# Patient Record
Sex: Female | Born: 1995 | Race: White | Hispanic: No | Marital: Single | Smoking: Never smoker
Health system: Southern US, Community
[De-identification: ages and names within clinical notes are randomized; demographics above are authoritative.]

## PROBLEM LIST (undated history)

## (undated) HISTORY — PX: FOOT FRACTURE SURGERY: SHX645

---

## 2013-08-05 ENCOUNTER — Emergency Department (HOSPITAL_COMMUNITY): Payer: BC Managed Care – PPO

## 2013-08-05 ENCOUNTER — Encounter (HOSPITAL_COMMUNITY): Payer: Self-pay | Admitting: Emergency Medicine

## 2013-08-05 ENCOUNTER — Emergency Department (HOSPITAL_COMMUNITY)
Admission: EM | Admit: 2013-08-05 | Discharge: 2013-08-05 | Disposition: A | Discharge status: Home / Self Care | Payer: BC Managed Care – PPO | Attending: Emergency Medicine | Admitting: Emergency Medicine

## 2013-08-05 DIAGNOSIS — Z9889 Other specified postprocedural states: Secondary | ICD-10-CM | POA: Insufficient documentation

## 2013-08-05 DIAGNOSIS — IMO0002 Reserved for concepts with insufficient information to code with codable children: Secondary | ICD-10-CM | POA: Insufficient documentation

## 2013-08-05 DIAGNOSIS — S93601A Unspecified sprain of right foot, initial encounter: Secondary | ICD-10-CM

## 2013-08-05 DIAGNOSIS — Y939 Activity, unspecified: Secondary | ICD-10-CM | POA: Insufficient documentation

## 2013-08-05 DIAGNOSIS — S93609A Unspecified sprain of unspecified foot, initial encounter: Secondary | ICD-10-CM | POA: Insufficient documentation

## 2013-08-05 DIAGNOSIS — Y929 Unspecified place or not applicable: Secondary | ICD-10-CM | POA: Insufficient documentation

## 2013-08-05 DIAGNOSIS — Z79899 Other long term (current) drug therapy: Secondary | ICD-10-CM | POA: Insufficient documentation
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
## 2013-08-05 NOTE — ED Notes (Signed)
Patient transported to X-ray 

## 2013-08-06 NOTE — ED Provider Notes (Signed)
Medical screening examination/treatment/procedure(s) were performed by non-physician practitioner and as supervising physician I was immediately available for consultation/collaboration.     Tinisha Etzkorn M Tesean Stump, MD 08/06/13 0233 

## 2015-08-05 ENCOUNTER — Emergency Department (INDEPENDENT_AMBULATORY_CARE_PROVIDER_SITE_OTHER): Payer: BLUE CROSS/BLUE SHIELD

## 2015-08-05 ENCOUNTER — Encounter (HOSPITAL_COMMUNITY): Payer: Self-pay | Admitting: Emergency Medicine

## 2015-08-05 ENCOUNTER — Emergency Department (HOSPITAL_COMMUNITY)
Admission: EM | Admit: 2015-08-05 | Discharge: 2015-08-05 | Disposition: A | Discharge status: Home / Self Care | Payer: BLUE CROSS/BLUE SHIELD | Source: Home / Self Care | Attending: Family Medicine | Admitting: Family Medicine

## 2015-08-05 DIAGNOSIS — G8929 Other chronic pain: Secondary | ICD-10-CM

## 2015-08-05 DIAGNOSIS — M25562 Pain in left knee: Secondary | ICD-10-CM

## 2015-08-05 MED ORDER — MELOXICAM 15 MG PO TABS: 7.5000 mg | ORAL_TABLET | Freq: Every day | ORAL | Status: DC

## 2015-08-05 NOTE — ED Notes (Signed)
Pt here with c/o intermit R knee pain s/p injury ice skating  xrays negative C/o sharp radiating pains behind the knee Limping

## 2015-08-05 NOTE — ED Provider Notes (Signed)
CSN: [id number]     Arrival date & time 08/05/15  1347 History   First MD Initiated Contact with Patient 08/05/15 1500     Chief Complaint  Patient presents with  . Knee Pain   (Consider location/radiation/quality/duration/timing/severity/associated sxs/prior Treatment) HPI  R knee pain. Injured knee during ice skating. Hurt ever since injury. Entire front of knee hurts. Occasional numb and sharp. Worse w/ ambulation. Ibuprofen 400 w/o improvement. No better or worse since onset. No radiation of pain.  Denies any rash, fevers, other joint involvement, spine weight loss fevers or night sweats.  History reviewed. No pertinent past medical history. Past Surgical History  Procedure Laterality Date  . Foot fracture surgery Right    History reviewed. No pertinent family history. Social History  Substance Use Topics  . Smoking status: Never Smoker   . Smokeless tobacco: None  . Alcohol Use: No   OB History    No data available     Review of Systems Per HPI with all other pertinent systems negative.   Allergies  Review of patient's allergies indicates no known allergies.  Home Medications   Prior to Admission medications   Medication Sig Start Date End Date Taking? Authorizing Provider  meloxicam (MOBIC) 15 MG tablet Take 0.5-1 tablets (7.5-15 mg total) by mouth daily. 08/05/15   Ozella Rocks, MD  PRESCRIPTION MEDICATION Take 1 capsule by mouth daily. Folcalin    Historical Provider, MD   Meds Ordered and Administered this Visit  Medications - No data to display  BP 130/75 mmHg  Pulse 112  Temp(Src) 98.6 F (37 C) (Oral)  Resp 16  SpO2 100%  LMP 06/13/2015 No data found.   Physical Exam Physical Exam  Constitutional: oriented to person, place, and time. appears well-developed and well-nourished. No distress.  HENT:  Head: Normocephalic and atraumatic.  Eyes: EOMI. PERRL.  Neck: Normal range of motion.  Cardiovascular: RRR, no m/r/g, 2+ distal pulses,   Pulmonary/Chest: Effort normal and breath sounds normal. No respiratory distress.  Abdominal: Soft. Bowel sounds are normal. NonTTP, no distension.  Musculoskeletal: R knee FROM, diffusely ttp intermittently. Lachman's negative, valgus and varus stresses without any change in amount of tenderness, and no appreciable laxity. Mild crepitus..  no effusions unable to fully bear weight on affected leg.  Neurological: alert and oriented to person, place, and time.  Skin: Skin is warm. No rash noted. non diaphoretic.  Psychiatric: normal mood and affect. behavior is normal. Judgment and thought content normal.   ED Course  Procedures (including critical care time)  Labs Review Labs Reviewed - No data to display  Imaging Review Dg Knee Complete 4 Views Right  08/05/2015  CLINICAL DATA:  Remote history of I skating injury, fall landing on right knee. Pain. EXAM: RIGHT KNEE - COMPLETE 4+ VIEW COMPARISON:  None. FINDINGS: There is no evidence of fracture, dislocation, or joint effusion. There is no evidence of arthropathy or other focal bone abnormality. Soft tissues are unremarkable. IMPRESSION: Negative. Electronically Signed   By: Charlett Nose M.D.   On: 08/05/2015 16:21     Visual Acuity Review  Right Eye Distance:   Left Eye Distance:   Bilateral Distance:    Right Eye Near:   Left Eye Near:    Bilateral Near:         MDM   1. Knee pain, chronic, left    No evidence of acute intra-articular process. Question meniscal injury or patellofemoral syndrome. Patient to start exercises, and use meloxicam.  Recommend patient use neoprene sleeve. Follow-up with her orthopedic surgeon back in [place] or sports medicine here in town. Patient requesting crutches. Patient's exam does not seem to fully correlate with history and x-ray findings.    Ozella Rocksavid J Kambree Krauss, MD 08/05/15 [phone]

## 2015-08-05 NOTE — Discharge Instructions (Signed)
The cause of your knee pain is not immediately clear. This may be due to a meniscal injury. Please start the meloxicam as prescribed. This will help your pain and inflammation. Please wear a neoprene knee sleeve every day. Use the crutches as needed. Please follow-up with either sports medicine here in town or with your home orthopedic surgeon.

## 2015-08-26 ENCOUNTER — Emergency Department (HOSPITAL_COMMUNITY)
Admission: EM | Admit: 2015-08-26 | Discharge: 2015-08-27 | Disposition: A | Discharge status: Home / Self Care | Payer: BLUE CROSS/BLUE SHIELD | Attending: Emergency Medicine | Admitting: Emergency Medicine

## 2015-08-26 ENCOUNTER — Encounter (HOSPITAL_COMMUNITY): Payer: Self-pay | Admitting: Emergency Medicine

## 2015-08-26 DIAGNOSIS — R45851 Suicidal ideations: Secondary | ICD-10-CM | POA: Diagnosis present

## 2015-08-26 DIAGNOSIS — R630 Anorexia: Secondary | ICD-10-CM | POA: Insufficient documentation

## 2015-08-26 DIAGNOSIS — Z79899 Other long term (current) drug therapy: Secondary | ICD-10-CM | POA: Insufficient documentation

## 2015-08-26 DIAGNOSIS — F39 Unspecified mood [affective] disorder: Secondary | ICD-10-CM | POA: Diagnosis not present

## 2015-08-26 DIAGNOSIS — F332 Major depressive disorder, recurrent severe without psychotic features: Secondary | ICD-10-CM | POA: Diagnosis not present

## 2015-08-26 LAB — CBC
HEMATOCRIT: 37.3 % (ref 36.0–46.0)
Hemoglobin: 12 g/dL (ref 12.0–15.0)
MCH: 25.7 pg — ABNORMAL LOW (ref 26.0–34.0)
MCHC: 32.2 g/dL (ref 30.0–36.0)
MCV: 79.9 fL (ref 78.0–100.0)
Platelets: 430 10*3/uL — ABNORMAL HIGH (ref 150–400)
RBC: 4.67 MIL/uL (ref 3.87–5.11)
RDW: 16.1 % — AB (ref 11.5–15.5)
WBC: 10.2 10*3/uL (ref 4.0–10.5)

## 2015-08-26 LAB — COMPREHENSIVE METABOLIC PANEL
ALK PHOS: 64 U/L (ref 38–126)
ALT: 15 U/L (ref 14–54)
AST: 23 U/L (ref 15–41)
Albumin: 4 g/dL (ref 3.5–5.0)
Anion gap: 8 (ref 5–15)
BILIRUBIN TOTAL: 0.4 mg/dL (ref 0.3–1.2)
BUN: 15 mg/dL (ref 6–20)
CO2: 22 mmol/L (ref 22–32)
Calcium: 9.6 mg/dL (ref 8.9–10.3)
Chloride: 110 mmol/L (ref 101–111)
Creatinine, Ser: 0.69 mg/dL (ref 0.44–1.00)
GFR calc Af Amer: >60 mL/min (ref >60)
GFR calc non Af Amer: >60 mL/min (ref >60)
Glucose, Bld: 117 mg/dL — ABNORMAL HIGH (ref 65–99)
Potassium: 3.5 mmol/L (ref 3.5–5.1)
SODIUM: 140 mmol/L (ref 135–145)
TOTAL PROTEIN: 7.1 g/dL (ref 6.5–8.1)

## 2015-08-26 LAB — ETHANOL: Alcohol, Ethyl (B): <5 mg/dL (ref <5)

## 2015-08-26 LAB — SALICYLATE LEVEL: Salicylate Lvl: <4 mg/dL (ref 2.8–30.0)

## 2015-08-26 LAB — ACETAMINOPHEN LEVEL: Acetaminophen (Tylenol), Serum: <10 ug/mL — ABNORMAL LOW (ref 10–30)

## 2015-08-26 MED ORDER — LORAZEPAM 1 MG PO TABS
1.0000 mg | ORAL_TABLET | Freq: Three times a day (TID) | ORAL | Status: DC | PRN
  Administered 2015-08-26: 1 mg via ORAL
  Filled 2015-08-26: qty 1

## 2015-08-26 MED ORDER — MELOXICAM 7.5 MG PO TABS: 7.5000 mg | ORAL_TABLET | Freq: Every day | ORAL | Status: DC

## 2015-08-26 MED ORDER — DEXMETHYLPHENIDATE HCL ER 5 MG PO CP24: 10.0000 mg | ORAL_CAPSULE | Freq: Every day | ORAL | Status: DC

## 2015-08-26 MED ORDER — ONDANSETRON HCL 4 MG PO TABS: 4.0000 mg | ORAL_TABLET | Freq: Three times a day (TID) | ORAL | Status: DC | PRN

## 2015-08-26 NOTE — ED Notes (Signed)
Pt. To [facility] from ED ambulatory without difficulty, to room 37 . Report from Beazer HomesFrances RN. Pt. Is alert and oriented, warm and dry in no distress. Pt. Denies SI, HI, and AVH. Pt. Calm and cooperative. Pt. Made aware of security cameras and Q15 minute rounds. Pt. Encouraged to let Nursing staff know of any concerns or needs.
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
## 2015-08-26 NOTE — ED Notes (Signed)
Pt's mothers number is [phone].  Pt's psychiatrist Lenord CarboBrian Narkey's  number is [phone]

## 2015-08-26 NOTE — ED Notes (Signed)
Pt has been changed and wanded by security.  

## 2015-08-26 NOTE — ED Notes (Signed)
Pt. Anxious and tearfull.
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
## 2015-08-26 NOTE — BH Assessment (Addendum)
Tele Assessment Note   Lisa Dickerson is an 19 y.o. female, single, Caucasian who presents to [facility] accompanied by Lisa Dickerson, Family Education Coordinator at [facility]. Per Ms. Sandridge, Pt has a diagnosis of ADHD and borderline intellectual functioning and is in a certificate program for students who have autism or other learning disabilities. Pt states she has ongoing conflicts with her roommate of two years and yesterday she overheard roommate tell someone that Pt should kill herself. Pt reports this made her extremely upset and she had suicidal thoughts with a plan to step out in front of a bus. Pt denies current suicidal ideation. Pt denies any history suicide attempts or para-suicidal behavior. Pt reports symptoms including crying spells, social withdrawal, decreased concentration, irritability and decreased appetite. She reports she felt hopeless yesterday but not currently. She describes feeling anxious and perseverating on roommate's comments. Pt denies homicidal ideation or history of violence. Pt denies any history of psychotic symptoms. Pt denies abusing alcohol or using any illicit substances.   Pt identifies conflict with her roommate as her primary stressor. She describes how her roommate has been having people over at their residence and that they are messy and disrespectful to Pt. Pt describes feeling bullied by roommate. Ms. Lisa Dickerson says she knows Pt's roommate and that roommate's behavior is not likely to change. Pt was born in [place] and was adopted at age [age] and does not know her biological family's history. She has been receiving outpatient therapy with Lisa Daisy, PhD since middle school and he is her current therapist. Pt has support and monitoring through the program at [facility]. Pt reports she is prescribed Focalin and Prozac and is compliant with medications. She identifies her parents, her biological brother and the people in the [facility] program as her primary  supports.  With Pt's permission this LPC contacted Lisa Daisy, PhD at [phone]. He says he has been in contact with Pt and Pt's parents. He reports Pt's IQ is in the 82's and she has a history of making suicidal statements, although typically not with a plan, and this is something they are working on. He reports Pt is typically avoidant of anything she receives as harmful and he is not concerned that she may harm herself. He says she needs to change her living situation and staff is going to arrange different accommodations. Pt stated she wanted to stay with a friend "Lisa Dickerson" tonight but Ms. Sandridge said this plan is not in place tonight.  Pt is dressed in hospital scrubs, alert, oriented x4 with normal speech and normal motor behavior. Eye contact is good. Pt's mood is anxious and affect is congruent with mood. Thought process is coherent and relevant. There is no indication Pt is currently responding to internal stimuli or experiencing delusional thought content. Pt was cooperative throughout assessment and is willing to follow recommendations of psychiatry.   Diagnosis: Adjustment Disorder With Disturbance of Mood  Past Medical History: History reviewed. No pertinent past medical history.  Past Surgical History  Procedure Laterality Date  . Foot fracture surgery Right     Family History: No family history on file.  Social History:  reports that she has never smoked. She does not have any smokeless tobacco history on file. She reports that she does not drink alcohol or use illicit drugs.  Additional Social History:  Alcohol / Drug Use Pain Medications: Denies use Prescriptions: See MAR Over the Counter: See MAR History of alcohol / drug use?: No history of  alcohol / drug abuse Longest period of sobriety (when/how long): NA  CIWA: CIWA-Ar BP: 141/82 mmHg Pulse Rate: (!) 140 COWS:    PATIENT STRENGTHS: (choose at least two) Ability for insight Investment banker, operational fund of knowledge Motivation for treatment/growth Physical Health Supportive family/friends  Allergies: No Known Allergies  Home Medications:  (Not in a hospital admission)  OB/GYN Status:  Patient's last menstrual period was 06/13/2015.  General Assessment Data Location of Assessment: [facility] TTS Assessment: In system Is this a Tele or Face-to-Face Assessment?: Face-to-Face Is this an Initial Assessment or a Re-assessment for this encounter?: Initial Assessment Marital status: Single Maiden name: NA Is patient pregnant?: No Pregnancy Status: No Living Arrangements: Non-relatives/Friends (Roommate) Can pt return to current living arrangement?: Yes Admission Status: Voluntary Is patient capable of signing voluntary admission?: Yes Referral Source: Self/Family/Friend Insurance type: BCBS     Crisis Care Plan Living Arrangements: Non-relatives/Friends Nurse, mental health) Name of Psychiatrist: Unknown Name of Therapist: Sherrin Daisy, PhD  Education Status Is patient currently in school?: Yes Current Grade: Senior college Highest grade of school patient has completed: Automotive engineer Name of school: [employer] person: Lisa Dickerson  Risk to self with the past 6 months Suicidal Ideation: No Has patient been a risk to self within the past 6 months prior to admission? : Yes Suicidal Intent: No Has patient had any suicidal intent within the past 6 months prior to admission? : No Is patient at risk for suicide?: No Suicidal Plan?: Yes-Currently Present Has patient had any suicidal plan within the past 6 months prior to admission? : Yes Specify Current Suicidal Plan: Thoughts of stepping out in front of a bus. Access to Means: Yes Specify Access to Suicidal Means: Access to traffic What has been your use of drugs/alcohol within the last 12 months?: Pt denies Previous Attempts/Gestures: No How many times?: 0 Other Self Harm Risks: None identified Triggers  for Past Attempts: None known Intentional Self Injurious Behavior: None Family Suicide History: Unknown (Pt adopted at age 28) Recent stressful life event(s): Conflict (Comment) (Conflict with roommate) Persecutory voices/beliefs?: No Depression: Yes Depression Symptoms: Tearfulness, Isolating Substance abuse history and/or treatment for substance abuse?: No Suicide prevention information given to non-admitted patients: Not applicable  Risk to Others within the past 6 months Homicidal Ideation: No Does patient have any lifetime risk of violence toward others beyond the six months prior to admission? : No Thoughts of Harm to Others: No Current Homicidal Intent: No Current Homicidal Plan: No Access to Homicidal Means: No Identified Victim: None History of harm to others?: No Assessment of Violence: None Noted Violent Behavior Description: None Does patient have access to weapons?: No Criminal Charges Pending?: No Does patient have a court date: No Is patient on probation?: No  Psychosis Hallucinations: None noted Delusions: None noted  Mental Status Report Appearance/Hygiene: In scrubs Eye Contact: Good Motor Activity: Unremarkable Speech: Logical/coherent Level of Consciousness: Alert Mood: Anxious Affect: Anxious Anxiety Level: Severe Thought Processes: Coherent, Relevant Judgement: Unimpaired Orientation: Person, Place, Time, Situation Obsessive Compulsive Thoughts/Behaviors: None  Cognitive Functioning Concentration: Normal Memory: Recent Intact, Remote Intact IQ: Average Insight: Fair Impulse Control: Good Appetite: Fair Weight Loss: 0 Weight Gain: 0 Sleep: Decreased Total Hours of Sleep: 6 Vegetative Symptoms: None  ADLScreening [facility] Assessment Services) Patient's cognitive ability adequate to safely complete daily activities?: Yes Patient able to express need for assistance with ADLs?: Yes Independently performs ADLs?: Yes (appropriate for developmental  age)  Prior Inpatient Therapy Prior Inpatient Therapy:  No Prior Therapy Dates: NA Prior Therapy Facilty/Provider(s): NA Reason for Treatment: NA  Prior Outpatient Therapy Prior Outpatient Therapy: Yes Prior Therapy Dates: Current Prior Therapy Facilty/Provider(s): Dr. Sherrin DaisyBrian Mackey Reason for Treatment: ADHD, life skills Does patient have an ACCT team?: No Does patient have Intensive In-House Services?  : No Does patient have [facility] services? : No Does patient have P4CC services?: No  ADL Screening (condition at time of admission) Patient's cognitive ability adequate to safely complete daily activities?: Yes Is the patient deaf or have difficulty hearing?: No Does the patient have difficulty seeing, even when wearing glasses/contacts?: No Does the patient have difficulty concentrating, remembering, or making decisions?: No Patient able to express need for assistance with ADLs?: Yes Does the patient have difficulty dressing or bathing?: No Independently performs ADLs?: Yes (appropriate for developmental age) Does the patient have difficulty walking or climbing stairs?: No Weakness of Legs: None Weakness of Arms/Hands: None  Home Assistive Devices/Equipment Home Assistive Devices/Equipment: None    Abuse/Neglect Assessment (Assessment to be complete while patient is alone) Physical Abuse: Denies Verbal Abuse: Denies Sexual Abuse: Denies Exploitation of patient/patient's resources: Denies Self-Neglect: Denies     Merchant navy officerAdvance Directives (For Healthcare) Does patient have an advance directive?: No Would patient like information on creating an advanced directive?: No - patient declined information    Additional Information 1:1 In Past 12 Months?: No CIRT Risk: No Elopement Risk: No Does patient have medical clearance?: Yes     Disposition: Consulted with Hulan FessIjeoma Nwaeze, NP who recommended Pt be observed in ED overnight and evaluated by psychiatry in the morning. Lisa HammingFran  Sandridge will arrange for tomorrow for Pt to reside with someone other than roommate if discharge is recommended. Notified Dr. Theodoro GristA Nanavati and Raford PitcherFrancis Dudley, RN of recommendation.  Disposition Initial Assessment Completed for this Encounter: Yes Disposition of Patient: Other dispositions Other disposition(s): Other (Comment) (Pt to be evaluated by psychiatry in the morning.)   Harlin RainFord Ellis Patsy BaltimoreWarrick Jr, [facility], [facility], [facility] Triage Specialist [phone]   Pamalee LeydenWarrick Jr, Tinie Mcgloin Ellis 08/26/2015 9:50 PM

## 2015-08-27 DIAGNOSIS — R45851 Suicidal ideations: Secondary | ICD-10-CM | POA: Insufficient documentation

## 2015-08-27 DIAGNOSIS — F332 Major depressive disorder, recurrent severe without psychotic features: Secondary | ICD-10-CM | POA: Diagnosis present

## 2015-08-27 NOTE — ED Notes (Signed)
Pt. Noted in room. No complaints or concerns voiced. No distress or abnormal behavior noted. Will continue to monitor with security cameras. Q 15 minute rounds continue. 

## 2015-08-27 NOTE — ED Notes (Signed)
Pt. Noted sleeping in room. No complaints or concerns voiced. No distress or abnormal behavior noted. Will continue to monitor with security cameras. Q 15 minute rounds continue. 

## 2015-08-27 NOTE — ED Notes (Signed)
Up on phone

## 2015-08-27 NOTE — Consult Note (Signed)
[facility] Face-to-Face Psychiatry Consult   Reason for Consult:  Depression, Suicide ideation Referring Physician:  EDP Patient Identification: Lisa Dickerson MRN:  [id number] Principal Diagnosis: Severe episode of recurrent major depressive disorder, without psychotic features [facility]) Diagnosis:   Patient Active Problem List   Diagnosis Date Noted  . Severe episode of recurrent major depressive disorder, without psychotic features ([place]) [F33.2] 08/27/2015    Priority: High    Total Time spent with patient: 45 minutes  Subjective:   Lisa Dickerson is a 19 y.o. female patient admitted with  Depression, Suicide ideation.  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  Patient reports that she had an argument with her roommate and she made a statement to to her support person about wanting to end her life.  Patient states she heard her roommate telling somebody over the phone that she wishes that this patient dies.  Patient states her feelings were hurt but then regrets making suicide statement.  She denies previous self harm or suicide attempt.  Patient reports a hx of ADHD, Depression and learning disability and is on a special program at [facility] for learning disability.  Patient denies any substance use or abuse including Alcohol.   She denies previous Psychiatric hospitalization.  Patient receives her Psychiatric medications from a Psychiatrist in [place].   She reports good sleep and appetite.  Patient vehemently denies SI/HI/AVH and is discharged home.  She will continue to receive care from [facility] and her Psychiatrist in [place].  Past Psychiatric History:  Depression, ADHD  Risk to Self: Suicidal Ideation: No Suicidal Intent: No Is patient at risk for suicide?: No Suicidal Plan?: Yes-Currently Present Specify Current Suicidal Plan: Thoughts of stepping out in front of a bus. Access to Means: Yes Specify Access to Suicidal  Means: Access to traffic What has been your use of drugs/alcohol within the last 12 months?: Pt denies How many times?: 0 Other Self Harm Risks: None identified Triggers for Past Attempts: None known Intentional Self Injurious Behavior: None Risk to Others: Homicidal Ideation: No Thoughts of Harm to Others: No Current Homicidal Intent: No Current Homicidal Plan: No Access to Homicidal Means: No Identified Victim: None History of harm to others?: No Assessment of Violence: None Noted Violent Behavior Description: None Does patient have access to weapons?: No Criminal Charges Pending?: No Does patient have a court date: No Prior Inpatient Therapy: Prior Inpatient Therapy: No Prior Therapy Dates: NA Prior Therapy Facilty/Provider(s): NA Reason for Treatment: NA Prior Outpatient Therapy: Prior Outpatient Therapy: Yes Prior Therapy Dates: Current Prior Therapy Facilty/Provider(s): Dr. Cecilio Asper Reason for Treatment: ADHD, life skills Does patient have an ACCT team?: No Does patient have Intensive In-House Services?  : No Does patient have [facility] services? : No Does patient have P4CC services?: No  Past Medical History: History reviewed. No pertinent past medical history.  Past Surgical History  Procedure Laterality Date  . Foot fracture surgery Right    Family History: No family history on file. Family Psychiatric  History:   Denies Social History:  History  Alcohol Use No     History  Drug Use No    Social History   Social History  . Marital Status: Single    Spouse Name: N/A  . Number of Children: N/A  . Years of Education: N/A   Social History Main Topics  . Smoking status: Never Smoker   . Smokeless tobacco: None  .  Alcohol Use: No  . Drug Use: No  . Sexual Activity: Not Asked   Other Topics Concern  . None   Social History Narrative   Additional Social History:    Pain Medications: Denies use Prescriptions: See MAR Over the Counter: See  MAR History of alcohol / drug use?: No history of alcohol / drug abuse Longest period of sobriety (when/how long): NA                     Allergies:  No Known Allergies  Labs:  Results for orders placed or performed during the hospital encounter of 08/26/15 (from the past 48 hour(s))  Comprehensive metabolic panel     Status: Abnormal   Collection Time: 08/26/15  3:58 PM  Result Value Ref Range   Sodium 140 135 - 145 mmol/L   Potassium 3.5 3.5 - 5.1 mmol/L   Chloride 110 101 - 111 mmol/L   CO2 22 22 - 32 mmol/L   Glucose, Bld 117 (H) 65 - 99 mg/dL   BUN 15 6 - 20 mg/dL   Creatinine, Ser 0.69 0.44 - 1.00 mg/dL   Calcium 9.6 8.9 - 10.3 mg/dL   Total Protein 7.1 6.5 - 8.1 g/dL   Albumin 4.0 3.5 - 5.0 g/dL   AST 23 15 - 41 U/L   ALT 15 14 - 54 U/L   Alkaline Phosphatase 64 38 - 126 U/L   Total Bilirubin 0.4 0.3 - 1.2 mg/dL   GFR calc non Af Amer >60 >60 mL/min   GFR calc Af Amer >60 >60 mL/min    Comment: (NOTE) The eGFR has been calculated using the CKD EPI equation. This calculation has not been validated in all clinical situations. eGFR's persistently <60 mL/min signify possible Chronic Kidney Disease.    Anion gap 8 5 - 15  Ethanol (ETOH)     Status: None   Collection Time: 08/26/15  3:58 PM  Result Value Ref Range   Alcohol, Ethyl (B) <5 <5 mg/dL    Comment:        LOWEST DETECTABLE LIMIT FOR SERUM ALCOHOL IS 5 mg/dL FOR MEDICAL PURPOSES ONLY   Salicylate level     Status: None   Collection Time: 08/26/15  3:58 PM  Result Value Ref Range   Salicylate Lvl <3.0 2.8 - 30.0 mg/dL  Acetaminophen level     Status: Abnormal   Collection Time: 08/26/15  3:58 PM  Result Value Ref Range   Acetaminophen (Tylenol), Serum <10 (L) 10 - 30 ug/mL    Comment:        THERAPEUTIC CONCENTRATIONS VARY SIGNIFICANTLY. A RANGE OF 10-30 ug/mL MAY BE AN EFFECTIVE CONCENTRATION FOR MANY PATIENTS. HOWEVER, SOME ARE BEST TREATED AT CONCENTRATIONS OUTSIDE  THIS RANGE. ACETAMINOPHEN CONCENTRATIONS >150 ug/mL AT 4 HOURS AFTER INGESTION AND >50 ug/mL AT 12 HOURS AFTER INGESTION ARE OFTEN ASSOCIATED WITH TOXIC REACTIONS.   CBC     Status: Abnormal   Collection Time: 08/26/15  3:58 PM  Result Value Ref Range   WBC 10.2 4.0 - 10.5 K/uL   RBC 4.67 3.87 - 5.11 MIL/uL   Hemoglobin 12.0 12.0 - 15.0 g/dL   HCT 37.3 36.0 - 46.0 %   MCV 79.9 78.0 - 100.0 fL   MCH 25.7 (L) 26.0 - 34.0 pg   MCHC 32.2 30.0 - 36.0 g/dL   RDW 16.1 (H) 11.5 - 15.5 %   Platelets 430 (H) 150 - 400 K/uL    Current Facility-Administered Medications  Medication Dose Route Frequency Provider Last Rate Last Dose  . dexmethylphenidate (FOCALIN XR) 24 hr capsule 10 mg  10 mg Oral Daily Varney Biles, MD   10 mg at 08/27/15 1039  . LORazepam (ATIVAN) tablet 1 mg  1 mg Oral Q8H PRN Varney Biles, MD   1 mg at 08/26/15 2306  . ondansetron (ZOFRAN) tablet 4 mg  4 mg Oral Q8H PRN Varney Biles, MD       Current Outpatient Prescriptions  Medication Sig Dispense Refill  . dexmethylphenidate (FOCALIN XR) 10 MG 24 hr capsule Take 10 mg by mouth daily.     Marland Kitchen FLUoxetine (PROZAC) 20 MG capsule Take 20 mg by mouth daily.    . meloxicam (MOBIC) 15 MG tablet Take 0.5-1 tablets (7.5-15 mg total) by mouth daily. (Patient not taking: Reported on 08/26/2015) 30 tablet 0    Musculoskeletal: Strength & Muscle Tone: within normal limits Gait & Station: normal Patient leans: N/A  Psychiatric Specialty Exam: Review of Systems  Constitutional: Negative.   HENT: Negative.   Eyes: Negative.   Respiratory: Negative.   Cardiovascular: Negative.   Gastrointestinal: Negative.   Genitourinary: Negative.   Musculoskeletal: Negative.   Skin: Negative.   Neurological: Negative.   Endo/Heme/Allergies: Negative.     Blood pressure 109/69, pulse 76, temperature 97.7 F (36.5 C), temperature source Oral, resp. rate 16, last menstrual period 06/13/2015, SpO2 100 %.There is no height or weight  on file to calculate BMI.  General Appearance: Casual  Eye Contact::  Good  Speech:  Clear and Coherent and Normal Rate  Volume:  Normal  Mood:  Depressed  Affect:  Congruent and Depressed  Thought Process:  Coherent, Goal Directed and Intact  Orientation:  Full (Time, Place, and Person)  Thought Content:  WDL  Suicidal Thoughts:  No  Homicidal Thoughts:  No  Memory:  Immediate;   Good Recent;   Good Remote;   Good  Judgement:  Good  Insight:  Good  Psychomotor Activity:  Normal  Concentration:  Good  Recall:  NA  Fund of Knowledge:Fair  Language: Good  Akathisia:  NA  Handed:  Right  AIMS (if indicated):     Assets:  Desire for Improvement  ADL's:  Intact  Cognition: Impaired,  Mild  Sleep:       Disposition: Discharge home, follow up with [facility] student health and her Psychiatrist in [place]   Delfin Gant   PMHNP-BC 08/27/2015 11:55 AM  Patient seen, chart reviewed and case discussed with the physician extender and formulated treatment plan. Reviewed the information documented by physician extender and agree with the treatment plan.Lisette Grinder R. 08/27/2015 4:18 PM

## 2015-08-27 NOTE — ED Notes (Signed)
Dr j and josephine np into see, school councelor present

## 2015-08-27 NOTE — ED Notes (Signed)
On the phone 

## 2015-08-27 NOTE — Progress Notes (Addendum)
10:06am. CSW spoke with pt's mother [phone]), who wanted an update on the plan for patient. CSW met with pt at bedside, and pt granted permission for staff to speak with mother. Pt also granted permission for staff to speak with [employer] worker Nilda Calamity.Consent on chart.  Pt is planning on calling a friend at discharge, so she can stay with her overnight and then address roommate issues in the days to come. Gala Lewandowsky, or other [employer] staff, will provide transportation back to campus. On-call number for [employer] staff is [phone]. CSW relayed plan to mother. Mother intends on calling pt later this AM prior to d/c to check in with her as well.  [place] Worker [facility] phone: [phone]

## 2015-08-27 NOTE — [facility] Suicide Risk Assessment (Cosign Needed)
Suicide Risk Assessment  Discharge Assessment   [facility]   Demographic Factors:  Caucasian, Low socioeconomic status, Unemployed and STUDENT AT [employer]  Total Time spent with patient: 20 minutes  Musculoskeletal: Strength & Muscle Tone: within normal limits Gait & Station: normal Patient leans: N/A  Psychiatric Specialty Exam:     Blood pressure 109/69, pulse 76, temperature 97.7 F (36.5 C), temperature source Oral, resp. rate 16, last menstrual period 06/13/2015, SpO2 100 %.There is no height or weight on file to calculate BMI.  General Appearance: Casual  Eye Contact::  Good  Speech:  Clear and Coherent and Normal Rate  Volume:  Normal  Mood:  Depressed  Affect:  Congruent and Depressed  Thought Process:  Coherent, Goal Directed and Intact  Orientation:  Full (Time, Place, and Person)  Thought Content:  WDL  Suicidal Thoughts:  No  Homicidal Thoughts:  No  Memory:  Immediate;   Good Recent;   Good Remote;   Good  Judgement:  Good  Insight:  Good  Psychomotor Activity:  Normal  Concentration:  Good  Recall:  NA  Fund of Knowledge:Fair  Language: Good  Akathisia:  NA  Handed:  Right  AIMS (if indicated):     Assets:  Desire for Improvement  ADL's:  Intact  Cognition: Impaired,  Mild        Has this patient used any form of tobacco in the last 30 days? (Cigarettes, Smokeless Tobacco, Cigars, and/or Pipes) N/A  Mental Status Per Nursing Assessment::   On Admission:     Current Mental Status by Physician: NA  Loss Factors: NA  Historical Factors: NA  Risk Reduction Factors:   Positive social support and Patient is involved in  a special program for students with learning disability at [employer].  Continued Clinical Symptoms:  Depression:   Severe More than one psychiatric diagnosis Previous Psychiatric Diagnoses and Treatments  Cognitive Features That Contribute To Risk:  None    Suicide Risk:  Minimal: No identifiable  suicidal ideation.  Patients presenting with no risk factors but with morbid ruminations; may be classified as minimal risk based on the severity of the depressive symptoms  Principal Problem: Severe episode of recurrent major depressive disorder, without psychotic features [facility](HCC) Discharge Diagnoses:  Patient Active Problem List   Diagnosis Date Noted  . Severe episode of recurrent major depressive disorder, without psychotic features ([facility]) [F33.2] 08/27/2015    Priority: High  . Suicidal ideations [R45.851]       Plan Of Care/Follow-up recommendations:  Activity:  As tolerated Diet:  Regular  Is patient on multiple antipsychotic therapies at discharge:  No   Has Patient had three or more failed trials of antipsychotic monotherapy by history:  No  Recommended Plan for Multiple Antipsychotic Therapies: NA    Tkai Large C   PMHNP-BC 08/27/2015, 12:18 PM

## 2015-08-27 NOTE — ED Notes (Signed)
Pt NAD. Pt denies SI/HI/AVH. Discharge documentation reviewed and given to pt. Pt belongings returned. Pt could not locate ring, and ring was note documented in belongings sheet. Pt ambulatory .Pt discharged by self, and met counselor in ED waiting area who is going to transport patient to current living situation. Pt endorsed the use of positive coping skills in the future when dealing with negative situations. Pt agreed to seek out support in the future if she has SI.

## 2017-03-06 IMAGING — DX DG KNEE COMPLETE 4+V*R*
4 series · 4 of 4 positions shown · non-contrast
Comparison: None.

CLINICAL DATA: Remote history of I skating injury, fall landing on
right knee. Pain.

EXAM:
RIGHT KNEE - COMPLETE 4+ VIEW

[knee ap (1 of 3)]
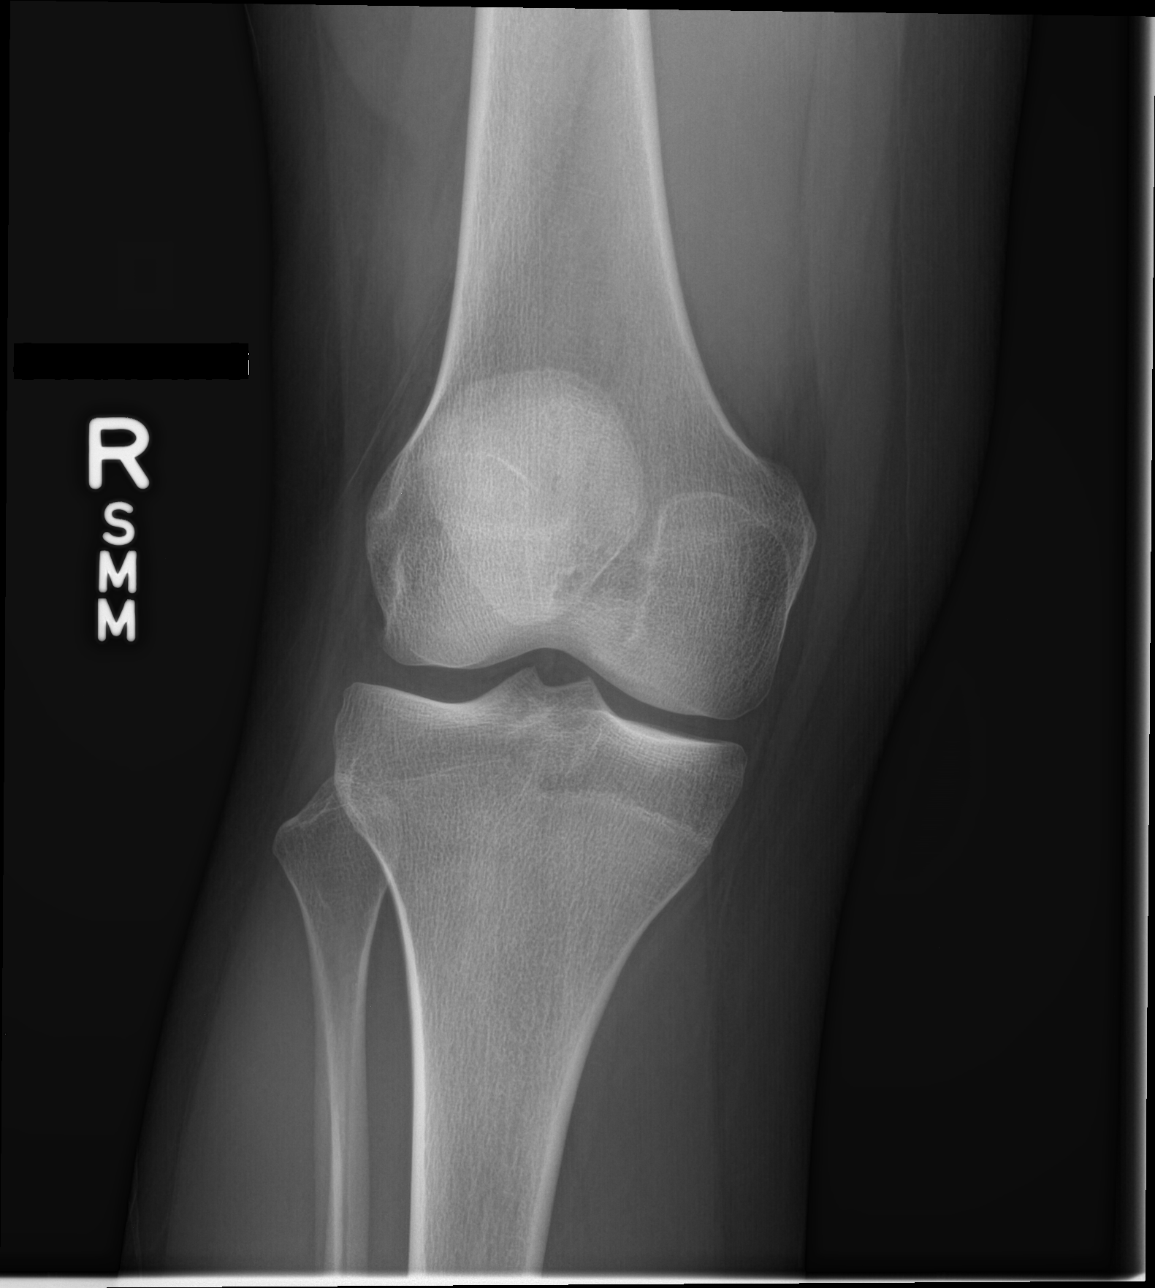

[knee lat]
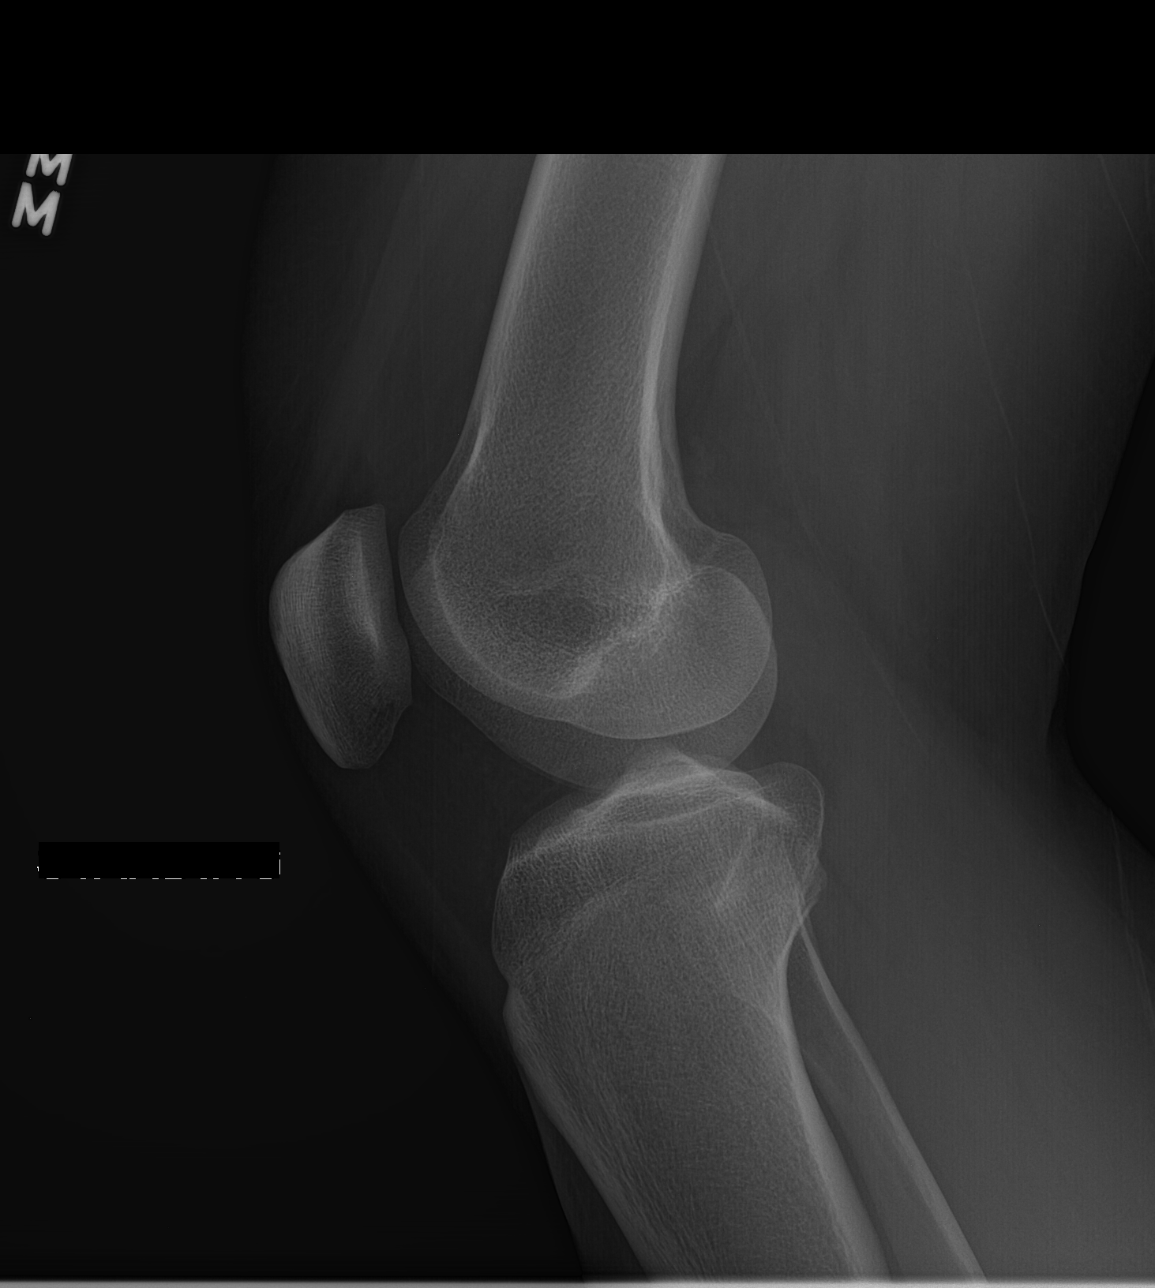

[knee ap (2 of 3)]
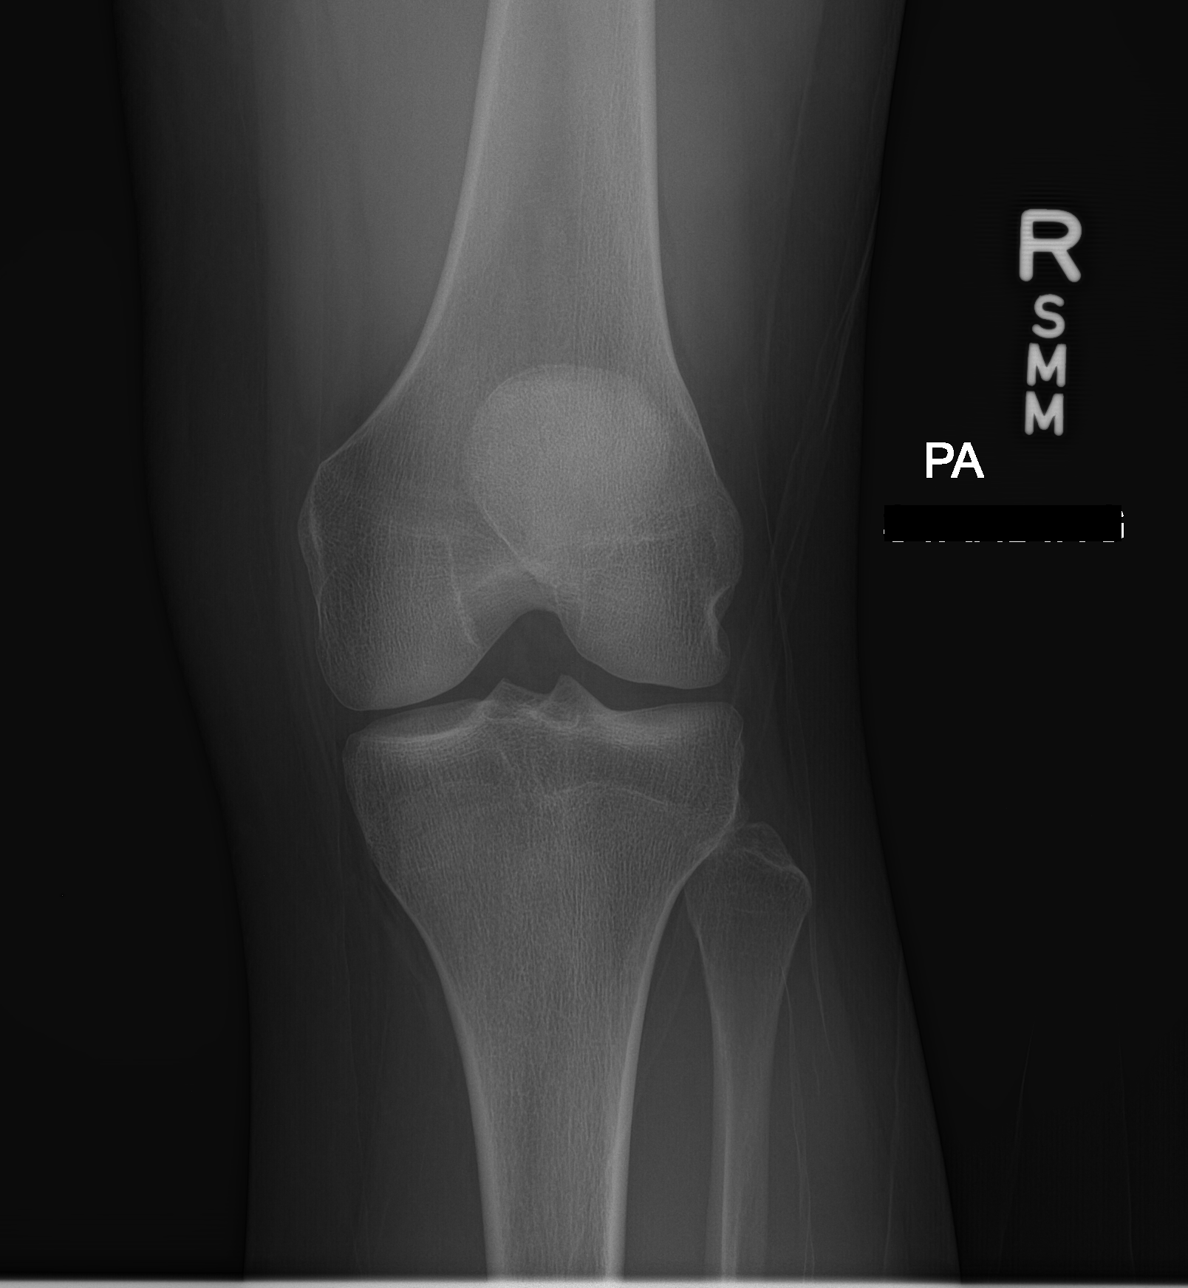

[knee ap (3 of 3)]
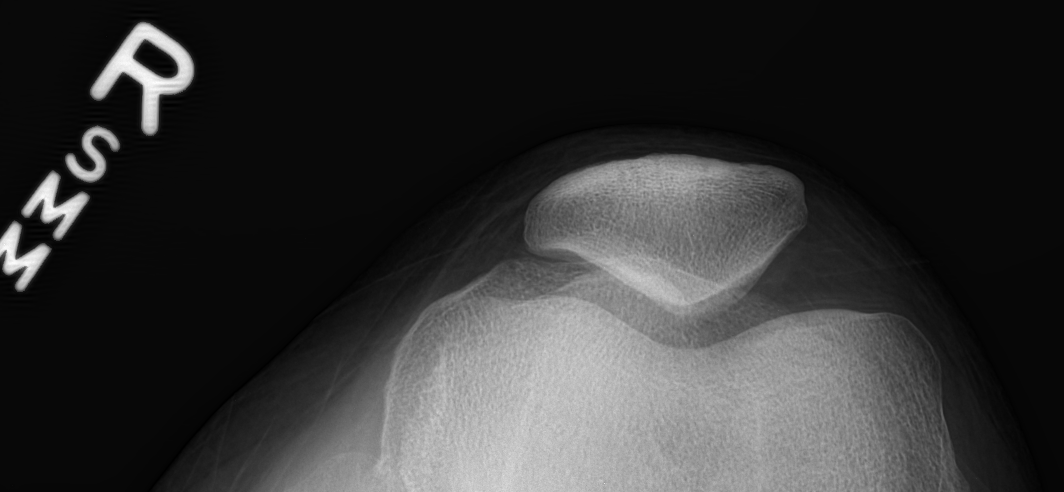

[4 of 4 positions shown; findings below may reference images not displayed]

FINDINGS: There is no evidence of fracture, dislocation, or joint effusion.
There is no evidence of arthropathy or other focal bone abnormality.
Soft tissues are unremarkable.
IMPRESSION: Negative.
